# Patient Record
Sex: Female | Born: 1995 | Race: Black or African American | Hispanic: No | Marital: Single | Smoking: Former smoker
Health system: Southern US, Community
[De-identification: ages and names within clinical notes are randomized; demographics above are authoritative.]

## PROBLEM LIST (undated history)

## (undated) ENCOUNTER — Inpatient Hospital Stay (HOSPITAL_COMMUNITY): Payer: Self-pay

## (undated) DIAGNOSIS — J45909 Unspecified asthma, uncomplicated: Secondary | ICD-10-CM

## (undated) DIAGNOSIS — I10 Essential (primary) hypertension: Secondary | ICD-10-CM

## (undated) DIAGNOSIS — A749 Chlamydial infection, unspecified: Secondary | ICD-10-CM

## (undated) HISTORY — PX: NO PAST SURGERIES: SHX2092

---
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
## 2014-01-23 ENCOUNTER — Encounter (HOSPITAL_COMMUNITY): Payer: Self-pay | Admitting: Emergency Medicine
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
## 2016-02-12 ENCOUNTER — Encounter (HOSPITAL_COMMUNITY): Payer: Self-pay

## 2016-02-12 ENCOUNTER — Emergency Department (HOSPITAL_COMMUNITY)
Admission: EM | Admit: 2016-02-12 | Discharge: 2016-02-12 | Disposition: A | Discharge status: Home / Self Care | Payer: No Typology Code available for payment source | Attending: Emergency Medicine | Admitting: Emergency Medicine

## 2016-02-12 DIAGNOSIS — Y939 Activity, unspecified: Secondary | ICD-10-CM | POA: Diagnosis not present

## 2016-02-12 DIAGNOSIS — S3992XA Unspecified injury of lower back, initial encounter: Secondary | ICD-10-CM | POA: Diagnosis present

## 2016-02-12 DIAGNOSIS — Y999 Unspecified external cause status: Secondary | ICD-10-CM | POA: Insufficient documentation

## 2016-02-12 DIAGNOSIS — Z87891 Personal history of nicotine dependence: Secondary | ICD-10-CM | POA: Diagnosis not present

## 2016-02-12 DIAGNOSIS — I10 Essential (primary) hypertension: Secondary | ICD-10-CM | POA: Insufficient documentation

## 2016-02-12 DIAGNOSIS — Y92410 Unspecified street and highway as the place of occurrence of the external cause: Secondary | ICD-10-CM | POA: Insufficient documentation

## 2016-02-12 DIAGNOSIS — J45909 Unspecified asthma, uncomplicated: Secondary | ICD-10-CM | POA: Diagnosis not present

## 2016-02-12 DIAGNOSIS — S39012A Strain of muscle, fascia and tendon of lower back, initial encounter: Secondary | ICD-10-CM | POA: Diagnosis not present

## 2016-02-12 LAB — CBC
HCT: 38.3 % (ref 36.0–46.0)
HEMOGLOBIN: 12.4 g/dL (ref 12.0–15.0)
MCH: 28.8 pg (ref 26.0–34.0)
MCHC: 32.4 g/dL (ref 30.0–36.0)
MCV: 88.9 fL (ref 78.0–100.0)
PLATELETS: 319 10*3/uL (ref 150–400)
RBC: 4.31 MIL/uL (ref 3.87–5.11)
RDW: 13.5 % (ref 11.5–15.5)
WBC: 5.8 10*3/uL (ref 4.0–10.5)

## 2016-02-12 LAB — COMPREHENSIVE METABOLIC PANEL
ALK PHOS: 60 U/L (ref 38–126)
ALT: 21 U/L (ref 14–54)
ANION GAP: 5 (ref 5–15)
AST: 19 U/L (ref 15–41)
Albumin: 4.1 g/dL (ref 3.5–5.0)
BILIRUBIN TOTAL: 0.6 mg/dL (ref 0.3–1.2)
BUN: 14 mg/dL (ref 6–20)
CALCIUM: 8.9 mg/dL (ref 8.9–10.3)
CO2: 25 mmol/L (ref 22–32)
CREATININE: 0.61 mg/dL (ref 0.44–1.00)
Chloride: 108 mmol/L (ref 101–111)
Glucose, Bld: 85 mg/dL (ref 65–99)
Potassium: 3.9 mmol/L (ref 3.5–5.1)
Sodium: 138 mmol/L (ref 135–145)
TOTAL PROTEIN: 7.2 g/dL (ref 6.5–8.1)

## 2016-02-12 MED ORDER — IBUPROFEN 800 MG PO TABS: 800.0000 mg | ORAL_TABLET | Freq: Three times a day (TID) | ORAL | 0 refills | Status: AC

## 2016-02-12 MED ORDER — METHOCARBAMOL 500 MG PO TABS: 500.0000 mg | ORAL_TABLET | Freq: Two times a day (BID) | ORAL | 0 refills | Status: DC
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
## 2016-02-12 NOTE — ED Notes (Signed)
ED Provider at bedside. 
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
## 2017-04-28 ENCOUNTER — Encounter (HOSPITAL_BASED_OUTPATIENT_CLINIC_OR_DEPARTMENT_OTHER): Payer: Self-pay | Admitting: *Deleted

## 2017-04-28 ENCOUNTER — Other Ambulatory Visit: Payer: Self-pay

## 2017-04-28 ENCOUNTER — Emergency Department (HOSPITAL_BASED_OUTPATIENT_CLINIC_OR_DEPARTMENT_OTHER)
Admission: EM | Admit: 2017-04-28 | Discharge: 2017-04-28 | Disposition: A | Discharge status: Home / Self Care | Payer: Medicaid Other | Attending: Emergency Medicine | Admitting: Emergency Medicine

## 2017-04-28 DIAGNOSIS — J4541 Moderate persistent asthma with (acute) exacerbation: Secondary | ICD-10-CM | POA: Diagnosis not present

## 2017-04-28 DIAGNOSIS — J45909 Unspecified asthma, uncomplicated: Secondary | ICD-10-CM | POA: Diagnosis not present

## 2017-04-28 DIAGNOSIS — R0602 Shortness of breath: Secondary | ICD-10-CM | POA: Diagnosis present

## 2017-04-28 DIAGNOSIS — F1210 Cannabis abuse, uncomplicated: Secondary | ICD-10-CM | POA: Diagnosis not present

## 2017-04-28 DIAGNOSIS — I10 Essential (primary) hypertension: Secondary | ICD-10-CM | POA: Diagnosis not present

## 2017-04-28 DIAGNOSIS — Z87891 Personal history of nicotine dependence: Secondary | ICD-10-CM | POA: Diagnosis not present

## 2017-04-28 MED ORDER — IPRATROPIUM-ALBUTEROL 0.5-2.5 (3) MG/3ML IN SOLN
3.0000 mL | Freq: Once | RESPIRATORY_TRACT | Status: AC
  Administered 2017-04-28: 3 mL via RESPIRATORY_TRACT

## 2017-04-28 MED ORDER — PREDNISONE 20 MG PO TABS: 40.0000 mg | ORAL_TABLET | Freq: Every day | ORAL | 0 refills | Status: AC

## 2017-04-28 MED ORDER — ALBUTEROL SULFATE (2.5 MG/3ML) 0.083% IN NEBU
2.5000 mg | INHALATION_SOLUTION | Freq: Once | RESPIRATORY_TRACT | Status: AC
  Administered 2017-04-28: 2.5 mg via RESPIRATORY_TRACT

## 2017-04-28 MED ORDER — IPRATROPIUM-ALBUTEROL 0.5-2.5 (3) MG/3ML IN SOLN
RESPIRATORY_TRACT | Status: AC
  Administered 2017-04-28: 3 mL via RESPIRATORY_TRACT
  Filled 2017-04-28: qty 3

## 2017-04-28 MED ORDER — ALBUTEROL SULFATE (2.5 MG/3ML) 0.083% IN NEBU
INHALATION_SOLUTION | RESPIRATORY_TRACT | Status: AC
  Administered 2017-04-28: 2.5 mg via RESPIRATORY_TRACT
  Filled 2017-04-28: qty 3

## 2017-04-28 MED ORDER — ALBUTEROL SULFATE HFA 108 (90 BASE) MCG/ACT IN AERS
2.0000 | INHALATION_SPRAY | RESPIRATORY_TRACT | Status: DC
  Administered 2017-04-28: 2 via RESPIRATORY_TRACT
  Filled 2017-04-28: qty 6.7

## 2017-04-28 MED FILL — predniSONE 20 MG TABS: 20 | 5 days supply | Qty: 10 | Fill #0

## 2017-04-28 NOTE — ED Notes (Addendum)
Patient states she is feeling more SOB O2 is 100% HR 102 RT called for assessment. Respirations is 28

## 2017-04-28 NOTE — ED Triage Notes (Signed)
Pt reports SOB x 30 mins pta. Already assess by RT and received breathing Tx

## 2017-04-28 NOTE — ED Notes (Signed)
Hx Asthma, HFA taken at work ( not hers). No current Rx for Asthma

## 2017-04-28 NOTE — ED Provider Notes (Signed)
[facility] EMERGENCY DEPARTMENT Provider Note   CSN: [id number] Arrival date & time: 04/28/17  1019     History   Chief Complaint Chief Complaint  Patient presents with  . Asthma    HPI Tammy Gonzales is a 22 y.o. female.  HPI 22 year old female with a history of asthma presents with acute shortness of breath which began today.  It started off mild and she felt like she was wheezing.  She then became worse.  Ultimately she used a friend's albuterol inhaler and was told to come to the emergency department for further evaluation.  She states she feels much better at this time.  She was wheezing on arrival and received 2 breathing treatments prior to my evaluation.  Denies fevers or chills.  No exertional shortness of breath.  Ambulates in the hall without issues at this time.  Feels much better would like to go home.   Past Medical History:  Diagnosis Date  . Asthma   . Chlamydia 2014   s/p treatment, ? TOC  . Hypertension    treated in 8th grade, unclear etiology, no recurrence since that time    Patient Active Problem List   Diagnosis Date Noted  . Leg swelling 02/19/2013  . Supervision of low-risk first pregnancy 02/07/2013  . Chlamydia infection complicating pregnancy in third trimester 02/07/2013  . Insufficient prenatal care in third trimester 02/07/2013    Past Surgical History:  Procedure Laterality Date  . NO PAST SURGERIES      OB History    Gravida Para Term Preterm AB Living   2 1 1  0 1 1   SAB TAB Ectopic Multiple Live Births   1 0 0 0 1       Home Medications    Prior to Admission medications   Medication Sig Start Date End Date Taking? Authorizing Provider  ibuprofen (ADVIL,MOTRIN) 800 MG tablet Take 1 tablet (800 mg total) by mouth 3 (three) times daily. 02/12/16   Arby Barrette, MD  methocarbamol (ROBAXIN) 500 MG tablet Take 1 tablet (500 mg total) by mouth 2 (two) times daily. Patient not taking: Reported on 02/12/2016 12/04/13    Piepenbrink, Victorino Dike, PA-C  methocarbamol (ROBAXIN) 500 MG tablet Take 1 tablet (500 mg total) by mouth 2 (two) times daily. 02/12/16   Arby Barrette, MD  predniSONE (DELTASONE) 20 MG tablet Take 2 tablets (40 mg total) by mouth daily for 5 days. 04/28/17 05/03/17  Azalia Bilis, MD  traMADol-acetaminophen (ULTRACET) 37.5-325 MG per tablet Take 1 tablet by mouth every 6 (six) hours as needed. Patient not taking: Reported on 02/12/2016 12/02/13   Garlon Hatchet, PA-C    Family History Family History  Problem Relation Age of Onset  . Diabetes Mother   . Hypertension Mother   . Hypertension Paternal Grandmother   . Sickle cell anemia Neg Hx   . Bleeding Disorder Neg Hx     Social History Social History   Tobacco Use  . Smoking status: Former Smoker    Types: Cigarettes  . Smokeless tobacco: Never Used  Substance Use Topics  . Alcohol use: Yes    Comment: occasional  . Drug use: Yes    Types: Marijuana    Comment: stopped in 05/2012     Allergies   Patient has no known allergies.   Review of Systems Review of Systems  All other systems reviewed and are negative.    Physical Exam Updated Vital Signs BP 137/66 (BP Location: Right Arm)  Pulse (!) 120 Comment: after HHN  Temp 98.4 F (36.9 C) (Oral)   Resp 20   Ht 5\' 1"  (1.549 m)   Wt 74.8 kg (165 lb)   LMP 03/26/2017   SpO2 99%   BMI 31.18 kg/m   Physical Exam  Constitutional: She is oriented to person, place, and time. She appears well-developed and well-nourished. No distress.  HENT:  Head: Normocephalic and atraumatic.  Eyes: EOM are normal.  Neck: Normal range of motion.  Cardiovascular: Normal rate and regular rhythm.  Pulmonary/Chest: Effort normal and breath sounds normal.  Abdominal: Soft. She exhibits no distension. There is no tenderness.  Musculoskeletal: Normal range of motion.  Neurological: She is alert and oriented to person, place, and time.  Skin: Skin is warm and dry.  Psychiatric: She  has a normal mood and affect. Judgment normal.  Nursing note and vitals reviewed.    ED Treatments / Results  Labs (all labs ordered are listed, but only abnormal results are displayed) Labs Reviewed - No data to display  EKG  EKG Interpretation None       Radiology No results found.  Procedures Procedures (including critical care time)  Medications Ordered in ED Medications  albuterol (PROVENTIL HFA;VENTOLIN HFA) 108 (90 Base) MCG/ACT inhaler 2 puff (not administered)  ipratropium-albuterol (DUONEB) 0.5-2.5 (3) MG/3ML nebulizer solution 3 mL (3 mLs Nebulization Given 04/28/17 1031)  albuterol (PROVENTIL) (2.5 MG/3ML) 0.083% nebulizer solution 2.5 mg (2.5 mg Nebulization Given 04/28/17 1031)  albuterol (PROVENTIL) (2.5 MG/3ML) 0.083% nebulizer solution 2.5 mg (2.5 mg Nebulization Given 04/28/17 1155)  ipratropium-albuterol (DUONEB) 0.5-2.5 (3) MG/3ML nebulizer solution 3 mL (3 mLs Nebulization Given 04/28/17 1155)     Initial Impression / Assessment and Plan / ED Course  I have reviewed the triage vital signs and the nursing notes.  Pertinent labs & imaging results that were available during my care of the patient were reviewed by me and considered in my medical decision making (see chart for details).     No longer wheezing.  Feels much better.  Suspect asthma exacerbation.  Lungs are clear now.  Doubt pneumonia.  Home with steroids and bronchodilators.  Patient understands return to the ER for new or worsening symptoms.  Final Clinical Impressions(s) / ED Diagnoses   Final diagnoses:  Moderate persistent asthma with exacerbation    ED Discharge Orders        Ordered    predniSONE (DELTASONE) 20 MG tablet  Daily     04/28/17 1329       Azalia Bilisampos, Datra Clary, MD 04/28/17 1337

## 2018-05-14 ENCOUNTER — Emergency Department (HOSPITAL_COMMUNITY)
Admission: EM | Admit: 2018-05-14 | Discharge: 2018-05-15 | Disposition: A | Discharge status: Home / Self Care | Payer: Medicaid Other | Attending: Emergency Medicine | Admitting: Emergency Medicine

## 2018-05-14 DIAGNOSIS — Z87891 Personal history of nicotine dependence: Secondary | ICD-10-CM | POA: Insufficient documentation

## 2018-05-14 DIAGNOSIS — Z79899 Other long term (current) drug therapy: Secondary | ICD-10-CM | POA: Insufficient documentation

## 2018-05-14 DIAGNOSIS — Z5320 Procedure and treatment not carried out because of patient's decision for unspecified reasons: Secondary | ICD-10-CM | POA: Insufficient documentation

## 2018-05-14 DIAGNOSIS — J45909 Unspecified asthma, uncomplicated: Secondary | ICD-10-CM | POA: Insufficient documentation

## 2018-05-14 DIAGNOSIS — R55 Syncope and collapse: Secondary | ICD-10-CM

## 2018-05-14 LAB — BASIC METABOLIC PANEL
ANION GAP: 10 (ref 5–15)
BUN: 9 mg/dL (ref 6–20)
CALCIUM: 8.8 mg/dL — AB (ref 8.9–10.3)
CHLORIDE: 105 mmol/L (ref 98–111)
CO2: 21 mmol/L — AB (ref 22–32)
CREATININE: 0.54 mg/dL (ref 0.44–1.00)
GFR calc Af Amer: >60 mL/min (ref >60)
GFR calc non Af Amer: >60 mL/min (ref >60)
GLUCOSE: 97 mg/dL (ref 70–99)
Potassium: 4.1 mmol/L (ref 3.5–5.1)
Sodium: 136 mmol/L (ref 135–145)

## 2018-05-14 LAB — CBC
HEMATOCRIT: 31.8 % — AB (ref 36.0–46.0)
Hemoglobin: 9.9 g/dL — ABNORMAL LOW (ref 12.0–15.0)
MCH: 25.4 pg — ABNORMAL LOW (ref 26.0–34.0)
MCHC: 31.1 g/dL (ref 30.0–36.0)
MCV: 81.5 fL (ref 80.0–100.0)
NRBC: 0 % (ref 0.0–0.2)
PLATELETS: 489 10*3/uL — AB (ref 150–400)
RBC: 3.9 MIL/uL (ref 3.87–5.11)
RDW: 16.3 % — ABNORMAL HIGH (ref 11.5–15.5)
WBC: 12.7 10*3/uL — ABNORMAL HIGH (ref 4.0–10.5)

## 2018-05-14 LAB — I-STAT BETA HCG BLOOD, ED (MC, WL, AP ONLY): I-stat hCG, quantitative: <5 m[IU]/mL (ref <5)

## 2018-05-14 MED ORDER — SODIUM CHLORIDE 0.9% FLUSH: 3.0000 mL | Freq: Once | INTRAVENOUS | Status: DC

## 2018-05-14 MED ORDER — KETOROLAC TROMETHAMINE 30 MG/ML IJ SOLN: 30.0000 mg | Freq: Once | INTRAMUSCULAR | Status: DC

## 2018-05-14 MED ORDER — SODIUM CHLORIDE 0.9 % IV BOLUS: 1000.0000 mL | Freq: Once | INTRAVENOUS | Status: DC

## 2018-05-14 NOTE — ED Notes (Signed)
Pt remains in waiting room. Updated on wait for treatment room. 

## 2018-05-14 NOTE — ED Triage Notes (Signed)
Pt was at work when she felt like she was getting overheated and dizzy, pt then passed out and was caught by a Radio broadcast assistant. Current complaint is a gen. H/a, has not been taking prescribed iron pills x 3 months. A&O x4.

## 2018-05-14 NOTE — ED Provider Notes (Signed)
[facility] EMERGENCY DEPARTMENT Provider Note   CSN: [id number] Arrival date & time: 05/14/18  1833    History   Chief Complaint Chief Complaint  Patient presents with  . Loss of Consciousness    HPI Tammy Gonzales is a 23 y.o. female.     The history is provided by the patient and medical records.  Loss of Consciousness     23 year old female with history of asthma, iron deficiency anemia, presenting to the ED after a syncopal event.  Patient reports she was recently sick with influenza and when she arrived at work today she noticed that she felt somewhat overheated and lightheaded.  She told her boss this and asked if she could eat something quickly, however but was told to immediately start work.  States symptoms worsened and she got increasingly lightheaded, apparently had a syncopal episode but woke up and coworkers arms on the floor.  Coworker reported to her that she caught her and was able to lower her to the ground.  There is no reported head injury or trauma.  No seizure activity.  Patient is unsure exactly how long she was unconscious.  States currently she just has a headache.  She denies any chest pain or shortness of breath.  She does have history of anemia, has not been taking her iron pills for 3 months.  When asked why she stated she was not sure, mostly she just did not think they were that important.  She has been trying to eat and drink, slightly less intake than normal given her recent illness.  She has not had any vomiting or diarrhea.  She does report some random, intermittent lower abdominal pains.  States it feels like a "menstrual cramp".  Her cycles have been regular.  She denies any urinary symptoms or pelvic pain.  Past Medical History:  Diagnosis Date  . Asthma   . Chlamydia 2014   s/p treatment, ? TOC  . Hypertension    treated in 8th grade, unclear etiology, no recurrence since that time    Patient Active Problem List   Diagnosis Date  Noted  . Leg swelling 02/19/2013  . Supervision of low-risk first pregnancy 02/07/2013  . Chlamydia infection complicating pregnancy in third trimester 02/07/2013  . Insufficient prenatal care in third trimester 02/07/2013    Past Surgical History:  Procedure Laterality Date  . NO PAST SURGERIES       OB History    Gravida  2   Para  1   Term  1   Preterm  0   AB  1   Living  1     SAB  1   TAB  0   Ectopic  0   Multiple  0   Live Births  1            Home Medications    Prior to Admission medications   Medication Sig Start Date End Date Taking? Authorizing Provider  ibuprofen (ADVIL,MOTRIN) 800 MG tablet Take 1 tablet (800 mg total) by mouth 3 (three) times daily. 02/12/16   Arby Barrette, MD  methocarbamol (ROBAXIN) 500 MG tablet Take 1 tablet (500 mg total) by mouth 2 (two) times daily. Patient not taking: Reported on 02/12/2016 12/04/13   Piepenbrink, Victorino Dike, PA-C  methocarbamol (ROBAXIN) 500 MG tablet Take 1 tablet (500 mg total) by mouth 2 (two) times daily. 02/12/16   Arby Barrette, MD  traMADol-acetaminophen (ULTRACET) 37.5-325 MG per tablet Take 1 tablet by mouth  every 6 (six) hours as needed. Patient not taking: Reported on 02/12/2016 12/02/13   Garlon Hatchet, PA-C    Family History Family History  Problem Relation Age of Onset  . Diabetes Mother   . Hypertension Mother   . Hypertension Paternal Grandmother   . Sickle cell anemia Neg Hx   . Bleeding Disorder Neg Hx     Social History Social History   Tobacco Use  . Smoking status: Former Smoker    Types: Cigarettes  . Smokeless tobacco: Never Used  Substance Use Topics  . Alcohol use: Yes    Comment: occasional  . Drug use: Yes    Types: Marijuana    Comment: stopped in 05/2012     Allergies   Patient has no known allergies.   Review of Systems Review of Systems  Cardiovascular: Positive for syncope.  Neurological: Positive for syncope.  All other systems reviewed  and are negative.    Physical Exam Updated Vital Signs BP 116/86   Pulse 100   Resp 16   LMP 04/15/2018   SpO2 100%   Physical Exam Vitals signs and nursing note reviewed.  Constitutional:      General: She is not in acute distress.    Appearance: She is well-developed. She is not diaphoretic.     Comments: texting on cell phone, upright in bed, NAD  HENT:     Head: Normocephalic and atraumatic.     Right Ear: Tympanic membrane, ear canal and external ear normal.     Left Ear: Tympanic membrane, ear canal and external ear normal.     Nose: Nose normal.     Mouth/Throat:     Lips: Pink.     Mouth: Mucous membranes are moist.     Pharynx: Oropharynx is clear.  Eyes:     Conjunctiva/sclera: Conjunctivae normal.     Pupils: Pupils are equal, round, and reactive to light.  Neck:     Musculoskeletal: Full passive range of motion without pain, normal range of motion and neck supple. No neck rigidity.     Comments: No rigidity, no meningismus Cardiovascular:     Rate and Rhythm: Normal rate and regular rhythm.     Heart sounds: Normal heart sounds. No murmur.  Pulmonary:     Effort: Pulmonary effort is normal. No respiratory distress.     Breath sounds: Normal breath sounds. No decreased breath sounds, wheezing or rhonchi.  Abdominal:     General: Bowel sounds are normal.     Palpations: Abdomen is soft.     Tenderness: There is no abdominal tenderness. There is no guarding.  Musculoskeletal: Normal range of motion.  Skin:    General: Skin is warm and dry.     Findings: No rash.  Neurological:     Mental Status: She is alert and oriented to person, place, and time.     Cranial Nerves: No cranial nerve deficit.     Sensory: No sensory deficit.     Motor: No tremor or seizure activity.     Comments: AAOx3, answering questions and following commands appropriately; equal strength UE and LE bilaterally; CN grossly intact; moves all extremities appropriately without ataxia; no  focal neuro deficits or facial asymmetry appreciated  Psychiatric:        Behavior: Behavior normal.        Thought Content: Thought content normal.      ED Treatments / Results  Labs (all labs ordered are listed, but only abnormal results  are displayed) Labs Reviewed  BASIC METABOLIC PANEL - Abnormal; Notable for the following components:      Result Value   CO2 21 (*)    Calcium 8.8 (*)    All other components within normal limits  CBC - Abnormal; Notable for the following components:   WBC 12.7 (*)    Hemoglobin 9.9 (*)    HCT 31.8 (*)    MCH 25.4 (*)    RDW 16.3 (*)    Platelets 489 (*)    All other components within normal limits  URINALYSIS, ROUTINE W REFLEX MICROSCOPIC  I-STAT BETA HCG BLOOD, ED ([facility], [facility], [facility] ONLY)  CBG MONITORING, ED    EKG None  Radiology No results found.  Procedures Procedures (including critical care time)  Medications Ordered in ED Medications  sodium chloride flush (NS) 0.9 % injection 3 mL (has no administration in time range)  sodium chloride 0.9 % bolus 1,000 mL (has no administration in time range)  ketorolac (TORADOL) 30 MG/ML injection 30 mg (has no administration in time range)     Initial Impression / Assessment and Plan / ED Course  I have reviewed the triage vital signs and the nursing notes.  Pertinent labs & imaging results that were available during my care of the patient were reviewed by me and considered in my medical decision making (see chart for details).  23 year old female here after syncopal episode at work.  Had been feeling lightheaded and somewhat overheated.  Recent influenza.  Has also not been taking her iron pills for about 3 months.  She is awake, alert, oriented to baseline.  She does report generalized headache but is neurologically intact.  No meningeal signs.  Labs are overall reassuring.  Hemoglobin is lower than prior, suspect this likely reflects her chronic iron deficiency anemia.  With recent flu  has not been eating and drinking as much as normal which may have contributed.  Has also had some intermittent lower abdominal pain, none at present.  Denies urinary symptoms or pelvic pain.  Plan for urinalysis.  Plan for IV fluids, Toradol for headache.  Will reassess.  11:49 PM Notified that patient has left.  I went to her room to try and talk to her but she was already gone, room had been cleaned.  She did not receive any of the fluids or medications I ordered for her at 10:15 PM.  She also did not provide urine sample.  I did not get a reassess, or talk with patient about final disposition, discharge instructions, or follow-up prior to her leaving the department.  Final Clinical Impressions(s) / ED Diagnoses   Final diagnoses:  Syncope, unspecified syncope type    ED Discharge Orders    None       Garlon HatchetSanders, Cloma Rahrig M, PA-C 05/15/18 0019    Pricilla LovelessGoldston, Scott, MD 05/15/18 2330

## 2018-05-15 NOTE — ED Notes (Signed)
Pt walked out before iv and ,eds given

## 2018-05-17 ENCOUNTER — Ambulatory Visit
Admission: EM | Admit: 2018-05-17 | Discharge: 2018-05-17 | Disposition: A | Discharge status: Home / Self Care | Payer: Self-pay

## 2018-05-17 ENCOUNTER — Encounter: Payer: Self-pay | Admitting: Emergency Medicine

## 2018-05-17 DIAGNOSIS — N3001 Acute cystitis with hematuria: Secondary | ICD-10-CM | POA: Insufficient documentation

## 2018-05-17 DIAGNOSIS — N760 Acute vaginitis: Secondary | ICD-10-CM | POA: Insufficient documentation

## 2018-05-17 LAB — POCT URINALYSIS DIP (MANUAL ENTRY)
Glucose, UA: 100 mg/dL — AB
NITRITE UA: POSITIVE — AB
PH UA: 6.5 (ref 5.0–8.0)
Spec Grav, UA: 1.025 (ref 1.010–1.025)
UROBILINOGEN UA: 2 U/dL — AB

## 2018-05-17 MED ORDER — METRONIDAZOLE 500 MG PO TABS: 500.0000 mg | ORAL_TABLET | Freq: Two times a day (BID) | ORAL | 0 refills | Status: DC

## 2018-05-17 MED ORDER — NITROFURANTOIN MONOHYD MACRO 100 MG PO CAPS: 100.0000 mg | ORAL_CAPSULE | Freq: Two times a day (BID) | ORAL | 0 refills | Status: DC

## 2018-05-17 MED ORDER — PHENAZOPYRIDINE HCL 200 MG PO TABS: 200.0000 mg | ORAL_TABLET | Freq: Three times a day (TID) | ORAL | 0 refills | Status: AC

## 2018-05-17 NOTE — ED Notes (Signed)
Patient able to ambulate independently  

## 2018-05-17 NOTE — ED Triage Notes (Signed)
Pt presents to [facility] for assessment of lower abdominal pain/pelvic pain x 2 weeks.  Denies discharge.  Pt c/o pressure feeling, pain with sexual intercourse.  LMP 05/17/18.

## 2018-05-17 NOTE — Discharge Instructions (Signed)
Take medications as prescribed.  Drink plenty of fluids.  We will only call you with abnormal results.  You can go onto MyChart in the next couple of days and look up the rest of your lab results.

## 2018-05-17 NOTE — ED Provider Notes (Signed)
[facility]    CSN: [id number] Arrival date & time: 05/17/18  1257     History   Chief Complaint Chief Complaint  Patient presents with  . Abdominal Pain    HPI Tammy Gonzales is a 23 y.o. female.   Subjective:   Tammy Gonzales is a 23 y.o. female who presents for evaluation of vaginal symptoms of perineal odor, vaginal discharge, dysuria, pain with intercourse and abdominal pain located in the suprapubic described as pressure-like without radiation. Symptoms have been present for 2 weeks.  Menstrual pattern: bleeding regularly and currently on her menses.   Contraception: Sexually active with one female partner; uses condoms 100% of his time with this partner but not always in the past.  STI Risk: Possible STD exposure   The following portions of the patient's history were reviewed and updated as appropriate: allergies, current medications, past family history, past medical history, past social history, past surgical history and problem list.        Past Medical History:  Diagnosis Date  . Asthma   . Chlamydia 2014   s/p treatment, ? TOC  . Hypertension    treated in 8th grade, unclear etiology, no recurrence since that time    Patient Active Problem List   Diagnosis Date Noted  . Leg swelling 02/19/2013  . Supervision of low-risk first pregnancy 02/07/2013  . Chlamydia infection complicating pregnancy in third trimester 02/07/2013  . Insufficient prenatal care in third trimester 02/07/2013    Past Surgical History:  Procedure Laterality Date  . NO PAST SURGERIES      OB History    Gravida  2   Para  1   Term  1   Preterm  0   AB  1   Living  1     SAB  1   TAB  0   Ectopic  0   Multiple  0   Live Births  1            Home Medications    Prior to Admission medications   Medication Sig Start Date End Date Taking? Authorizing Provider  ferrous sulfate 325 (65 FE) MG tablet Take 325 mg by mouth daily with breakfast.   Yes  [provider]  ibuprofen (ADVIL,MOTRIN) 800 MG tablet Take 1 tablet (800 mg total) by mouth 3 (three) times daily. 02/12/16  Yes Arby Barrette, MD  metroNIDAZOLE (FLAGYL) 500 MG tablet Take 1 tablet (500 mg total) by mouth 2 (two) times daily. 05/17/18   Lurline Idol, FNP  nitrofurantoin, macrocrystal-monohydrate, (MACROBID) 100 MG capsule Take 1 capsule (100 mg total) by mouth 2 (two) times daily. 05/17/18   Lurline Idol, FNP  phenazopyridine (PYRIDIUM) 200 MG tablet Take 1 tablet (200 mg total) by mouth 3 (three) times daily. 05/17/18   Lurline Idol, FNP    Family History Family History  Problem Relation Age of Onset  . Diabetes Mother   . Hypertension Mother   . Hypertension Paternal Grandmother   . Sickle cell anemia Neg Hx   . Bleeding Disorder Neg Hx     Social History Social History   Tobacco Use  . Smoking status: Former Smoker    Types: Cigarettes  . Smokeless tobacco: Never Used  Substance Use Topics  . Alcohol use: Yes    Comment: occasional  . Drug use: Yes    Types: Marijuana    Comment: stopped in 05/2012     Allergies   Patient has no known allergies.  Review of Systems Review of Systems  Gastrointestinal: Positive for abdominal pain. Negative for nausea and vomiting.  Genitourinary: Positive for dyspareunia, dysuria, pelvic pain and vaginal discharge. Negative for flank pain and frequency.  Musculoskeletal: Negative for back pain.  All other systems reviewed and are negative.    Physical Exam Triage Vital Signs ED Triage Vitals  Enc Vitals Group     BP 05/17/18 1337 116/81     Pulse Rate 05/17/18 1337 (!) 109     Resp 05/17/18 1337 16     Temp 05/17/18 1337 98.3 F (36.8 C)     Temp Source 05/17/18 1337 Oral     SpO2 05/17/18 1337 99 %     Weight --      Height --      Head Circumference --      Peak Flow --      Pain Score 05/17/18 1338 0     Pain Loc --      Pain Edu? --      Excl. in GC? --    No data  found.  Updated Vital Signs BP 116/81 (BP Location: Left Arm)   Pulse (!) 109   Temp 98.3 F (36.8 C) (Oral)   Resp 16   LMP 05/17/2018   SpO2 99%   Visual Acuity Right Eye Distance:   Left Eye Distance:   Bilateral Distance:    Right Eye Near:   Left Eye Near:    Bilateral Near:     Physical Exam Vitals signs reviewed.  Constitutional:      General: She is not in acute distress.    Appearance: She is well-developed. She is not ill-appearing, toxic-appearing or diaphoretic.  HENT:     Head: Normocephalic.  Cardiovascular:     Rate and Rhythm: Normal rate and regular rhythm.  Pulmonary:     Effort: Pulmonary effort is normal.     Breath sounds: Normal breath sounds.  Abdominal:     General: Bowel sounds are normal.     Palpations: Abdomen is soft.     Tenderness: There is no abdominal tenderness. There is no right CVA tenderness or left CVA tenderness.  Skin:    General: Skin is warm.  Neurological:     General: No focal deficit present.     Mental Status: She is alert and oriented to person, place, and time.  Psychiatric:        Mood and Affect: Mood normal.        Behavior: Behavior normal.      UC Treatments / Results  Labs (all labs ordered are listed, but only abnormal results are displayed) Labs Reviewed  POCT URINALYSIS DIP (MANUAL ENTRY) - Abnormal; Notable for the following components:      Result Value   Color, UA red (*)    Clarity, UA cloudy (*)    Glucose, UA =100 (*)    Bilirubin, UA moderate (*)    Ketones, POC UA trace (5) (*)    Blood, UA large (*)    Protein Ur, POC >=300 (*)    Urobilinogen, UA 2.0 (*)    Nitrite, UA Positive (*)    Leukocytes, UA Large (3+) (*)    All other components within normal limits  CERVICOVAGINAL ANCILLARY ONLY    EKG None  Radiology No results found.  Procedures Procedures (including critical care time)  Medications Ordered in UC Medications - No data to display  Initial Impression /  Assessment and Plan /  UC Course  I have reviewed the triage vital signs and the nursing notes.  Pertinent labs & imaging results that were available during my care of the patient were reviewed by me and considered in my medical decision making (see chart for details).     23 year old female presenting with vaginal discharge, dysuria, pain with intercourse and abdominal pressure for 2 weeks.  She is sexually active.  She currently uses condoms but has not in the past.  She is unsure of possible STD exposure. Urine dipstick shows 3+ for leukocyte esterase and positive for nitrites.  Will treat for UTI.  Nu swab for GC, chlamydia, BV and yeast pending.    Plan: 1. Medications: nitrofurantoin and flagyl  2. Maintain adequate hydration 3. Abstinence from intercourse discussed. 4. Discussed safe sex. 5. Follow up if symptoms not improving, and prn.  Today's evaluation has revealed no signs of a dangerous process. Discussed diagnosis with patient. Patient aware of their diagnosis, possible red flag symptoms to watch out for and need for close follow up. Patient understands verbal and written discharge instructions. Patient comfortable with plan and disposition.  Patient has a clear mental status at this time, good insight into illness (after discussion and teaching) and has clear judgment to make decisions regarding their care.  Documentation was completed with the aid of voice recognition software. Transcription may contain typographical errors. Final Clinical Impressions(s) / UC Diagnoses   Final diagnoses:  Acute cystitis with hematuria  Acute vaginitis     Discharge Instructions     Take medications as prescribed.  Drink plenty of fluids.  We will only call you with abnormal results.  You can go onto MyChart in the next couple of days and look up the rest of your lab results.    ED Prescriptions    Medication Sig Dispense Auth. Provider   nitrofurantoin, macrocrystal-monohydrate,  (MACROBID) 100 MG capsule Take 1 capsule (100 mg total) by mouth 2 (two) times daily. 10 capsule Lurline Idol, FNP   metroNIDAZOLE (FLAGYL) 500 MG tablet Take 1 tablet (500 mg total) by mouth 2 (two) times daily. 14 tablet Lurline Idol, FNP   phenazopyridine (PYRIDIUM) 200 MG tablet Take 1 tablet (200 mg total) by mouth 3 (three) times daily. 6 tablet Lurline Idol, FNP     Controlled Substance Prescriptions [place] Controlled Substance Registry consulted? Not Applicable   Lurline Idol, [place] 05/17/18 [phone]

## 2018-05-19 LAB — URINE CULTURE: Special Requests: NORMAL

## 2018-05-19 LAB — CERVICOVAGINAL ANCILLARY ONLY
BACTERIAL VAGINITIS: POSITIVE — AB
CHLAMYDIA, DNA PROBE: POSITIVE — AB
Candida vaginitis: NEGATIVE
NEISSERIA GONORRHEA: POSITIVE — AB
TRICH (WINDOWPATH): NEGATIVE

## 2018-05-20 ENCOUNTER — Encounter (HOSPITAL_COMMUNITY): Payer: Self-pay

## 2018-05-20 ENCOUNTER — Telehealth (HOSPITAL_COMMUNITY): Payer: Self-pay | Admitting: Emergency Medicine

## 2018-05-20 NOTE — Telephone Encounter (Signed)
Bacterial Vaginosis test is positive.  Prescription for metronidazole was given at the urgent care visit.   Chlamydia is positive. This was not treated  Pt needs education to please refrain from sexual intercourse for 7 days to give the medicine time to work, sexual partners need to be notified and tested/treated.  Condoms may reduce risk of reinfection.  Recheck or followup with PCP for further evaluation if symptoms are not improving.   [facility] notified.  Gonorrhea is positive.  Patient should return as soon as possible to the urgent care for treatment with IM rocephin 250mg  and po zithromax 1g. Patient will not need to see a provider unless there are new symptoms she would like evaluated. Pt needs education to refrain from sexual intercourse for now and for 7 days after treatment to give the medicine time to work. Sexual partners need to be notified and tested/treated. Condoms may reduce risk of reinfection. [facility] notified.   Attempted to reach patient. No answer at this time. Voicemail left.

## 2018-05-22 ENCOUNTER — Telehealth (HOSPITAL_COMMUNITY): Payer: Self-pay | Admitting: Emergency Medicine

## 2018-05-22 NOTE — Telephone Encounter (Signed)
Patient contacted and made aware of all results, all questions answered. 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

## 2018-07-22 ENCOUNTER — Other Ambulatory Visit: Payer: Self-pay

## 2018-07-22 ENCOUNTER — Emergency Department (HOSPITAL_COMMUNITY)
Admission: EM | Admit: 2018-07-22 | Discharge: 2018-07-22 | Discharge status: Against Medical Advice | Payer: Self-pay | Attending: Emergency Medicine | Admitting: Emergency Medicine

## 2018-07-22 DIAGNOSIS — R1031 Right lower quadrant pain: Secondary | ICD-10-CM | POA: Insufficient documentation

## 2018-07-22 DIAGNOSIS — I10 Essential (primary) hypertension: Secondary | ICD-10-CM | POA: Insufficient documentation

## 2018-07-22 DIAGNOSIS — Z87891 Personal history of nicotine dependence: Secondary | ICD-10-CM | POA: Insufficient documentation

## 2018-07-22 DIAGNOSIS — Z79899 Other long term (current) drug therapy: Secondary | ICD-10-CM | POA: Insufficient documentation

## 2018-07-22 DIAGNOSIS — J45909 Unspecified asthma, uncomplicated: Secondary | ICD-10-CM | POA: Insufficient documentation

## 2018-07-22 LAB — WET PREP, GENITAL
Clue Cells Wet Prep HPF POC: NONE SEEN
Sperm: NONE SEEN
Trich, Wet Prep: NONE SEEN
Yeast Wet Prep HPF POC: NONE SEEN

## 2018-07-22 LAB — CBC WITH DIFFERENTIAL/PLATELET
Abs Immature Granulocytes: 0.04 10*3/uL (ref 0.00–0.07)
Basophils Absolute: 0 10*3/uL (ref 0.0–0.1)
Basophils Relative: 0 %
Eosinophils Absolute: 0.1 10*3/uL (ref 0.0–0.5)
Eosinophils Relative: 1 %
HCT: 27 % — ABNORMAL LOW (ref 36.0–46.0)
Hemoglobin: 8.5 g/dL — ABNORMAL LOW (ref 12.0–15.0)
Immature Granulocytes: 0 %
Lymphocytes Relative: 12 %
Lymphs Abs: 1.1 10*3/uL (ref 0.7–4.0)
MCH: 24 pg — ABNORMAL LOW (ref 26.0–34.0)
MCHC: 31.5 g/dL (ref 30.0–36.0)
MCV: 76.3 fL — ABNORMAL LOW (ref 80.0–100.0)
Monocytes Absolute: 0.8 10*3/uL (ref 0.1–1.0)
Monocytes Relative: 9 %
Neutro Abs: 7.6 10*3/uL (ref 1.7–7.7)
Neutrophils Relative %: 78 %
Platelets: 463 10*3/uL — ABNORMAL HIGH (ref 150–400)
RBC: 3.54 MIL/uL — ABNORMAL LOW (ref 3.87–5.11)
RDW: 19.7 % — ABNORMAL HIGH (ref 11.5–15.5)
WBC: 9.7 10*3/uL (ref 4.0–10.5)
nRBC: 0 % (ref 0.0–0.2)

## 2018-07-22 LAB — COMPREHENSIVE METABOLIC PANEL
ALT: 17 U/L (ref 0–44)
AST: 13 U/L — ABNORMAL LOW (ref 15–41)
Albumin: 3 g/dL — ABNORMAL LOW (ref 3.5–5.0)
Alkaline Phosphatase: 62 U/L (ref 38–126)
Anion gap: 11 (ref 5–15)
BUN: 9 mg/dL (ref 6–20)
CO2: 21 mmol/L — ABNORMAL LOW (ref 22–32)
Calcium: 8.6 mg/dL — ABNORMAL LOW (ref 8.9–10.3)
Chloride: 107 mmol/L (ref 98–111)
Creatinine, Ser: 0.62 mg/dL (ref 0.44–1.00)
GFR calc Af Amer: >60 mL/min (ref >60)
GFR calc non Af Amer: >60 mL/min (ref >60)
Glucose, Bld: 87 mg/dL (ref 70–99)
Potassium: 3 mmol/L — ABNORMAL LOW (ref 3.5–5.1)
Sodium: 139 mmol/L (ref 135–145)
Total Bilirubin: 0.3 mg/dL (ref 0.3–1.2)
Total Protein: 7.6 g/dL (ref 6.5–8.1)

## 2018-07-22 LAB — URINALYSIS, ROUTINE W REFLEX MICROSCOPIC
Bilirubin Urine: NEGATIVE
Glucose, UA: NEGATIVE mg/dL
Ketones, ur: 20 mg/dL — AB
Leukocytes,Ua: NEGATIVE
Nitrite: NEGATIVE
Protein, ur: 30 mg/dL — AB
Specific Gravity, Urine: 1.027 (ref 1.005–1.030)
pH: 5 (ref 5.0–8.0)

## 2018-07-22 LAB — POC URINE PREG, ED: Preg Test, Ur: NEGATIVE

## 2018-07-22 MED ORDER — ONDANSETRON HCL 4 MG/2ML IJ SOLN
4.0000 mg | Freq: Once | INTRAMUSCULAR | Status: AC
  Administered 2018-07-22: 4 mg via INTRAVENOUS
  Filled 2018-07-22: qty 2

## 2018-07-22 MED ORDER — FENTANYL CITRATE (PF) 100 MCG/2ML IJ SOLN
50.0000 ug | Freq: Once | INTRAMUSCULAR | Status: AC
  Administered 2018-07-22: 15:00:00 50 ug via INTRAVENOUS
  Filled 2018-07-22: qty 2

## 2018-07-22 NOTE — ED Triage Notes (Signed)
Pt arrives pov with c/o of lower abd pain.

## 2018-07-22 NOTE — ED Notes (Signed)
Went to check on pt and found that she was not in the room. Her gown was left on the bed and her belongings were gone. Checked nearest bathroom for pt and she was not found.

## 2018-07-22 NOTE — ED Provider Notes (Signed)
[facility] EMERGENCY DEPARTMENT Provider Note   CSN: [id number] Arrival date & time: 07/22/18  1249    History   Chief Complaint Chief Complaint  Patient presents with  . Abdominal Pain    HPI Tammy Gonzales is a 23 y.o. female.     The history is provided by the patient and medical records. No language interpreter was used.  Abdominal Pain   Tammy Gonzales is a 23 y.o. female who presents to the Emergency Department complaining of abdominal pain.  She presents to the ED complaining of one week of lower abdominal pain located in the suprapubic and RLQ area.  Pain began abruptly and was severe in onset.  It has been waxing and waning over the last week but gradually worsnening.  She has associated nausea and three days of dysuria.  She has vaginal discharge that she describes as chronic and unchanged from baseline.  She is sexually active with a new partner and does not use protection.  No prior similar sxs.  LMP yest4erday. Past Medical History:  Diagnosis Date  . Asthma   . Chlamydia 2014   s/p treatment, ? TOC  . Hypertension    treated in 8th grade, unclear etiology, no recurrence since that time    Patient Active Problem List   Diagnosis Date Noted  . Leg swelling 02/19/2013  . Supervision of low-risk first pregnancy 02/07/2013  . Chlamydia infection complicating pregnancy in third trimester 02/07/2013  . Insufficient prenatal care in third trimester 02/07/2013    Past Surgical History:  Procedure Laterality Date  . NO PAST SURGERIES       OB History    Gravida  2   Para  1   Term  1   Preterm  0   AB  1   Living  1     SAB  1   TAB  0   Ectopic  0   Multiple  0   Live Births  1            Home Medications    Prior to Admission medications   Medication Sig Start Date End Date Taking? Authorizing Provider  ferrous sulfate 325 (65 FE) MG tablet Take 325 mg by mouth daily with breakfast.    [provider]   ibuprofen (ADVIL,MOTRIN) 800 MG tablet Take 1 tablet (800 mg total) by mouth 3 (three) times daily. 02/12/16   Arby Barrette, MD  metroNIDAZOLE (FLAGYL) 500 MG tablet Take 1 tablet (500 mg total) by mouth 2 (two) times daily. 05/17/18   Lurline Idol, FNP  nitrofurantoin, macrocrystal-monohydrate, (MACROBID) 100 MG capsule Take 1 capsule (100 mg total) by mouth 2 (two) times daily. 05/17/18   Lurline Idol, FNP  phenazopyridine (PYRIDIUM) 200 MG tablet Take 1 tablet (200 mg total) by mouth 3 (three) times daily. 05/17/18   Lurline Idol, FNP    Family History Family History  Problem Relation Age of Onset  . Diabetes Mother   . Hypertension Mother   . Hypertension Paternal Grandmother   . Sickle cell anemia Neg Hx   . Bleeding Disorder Neg Hx     Social History Social History   Tobacco Use  . Smoking status: Former Smoker    Types: Cigarettes  . Smokeless tobacco: Never Used  Substance Use Topics  . Alcohol use: Yes    Comment: occasional  . Drug use: Yes    Types: Marijuana    Comment: stopped in 05/2012  Allergies   Patient has no known allergies.   Review of Systems Review of Systems  Gastrointestinal: Positive for abdominal pain.  All other systems reviewed and are negative.    Physical Exam Updated Vital Signs BP 111/61 (BP Location: Right Arm)   Pulse 100   Temp 98.1 F (36.7 C) (Oral)   Resp 16   Ht 5\' 2"  (1.575 m)   Wt 64.9 kg   SpO2 100%   BMI 26.16 kg/m   Physical Exam Vitals signs and nursing note reviewed.  Constitutional:      Appearance: She is well-developed.  HENT:     Head: Normocephalic and atraumatic.  Cardiovascular:     Rate and Rhythm: Normal rate and regular rhythm.     Heart sounds: No murmur.  Pulmonary:     Effort: Pulmonary effort is normal. No respiratory distress.     Breath sounds: Normal breath sounds.  Abdominal:     Palpations: Abdomen is soft.     Tenderness: There is no rebound.     Comments:  Moderate suprapubic and lower abdominal tenderness, voluntary guarding.    Genitourinary:    Comments: Small amount of vaginal bleeding.  Moderate right adnexal tenderness without mass.  No CMT Musculoskeletal:        General: No tenderness.  Skin:    General: Skin is warm and dry.  Neurological:     Mental Status: She is alert and oriented to person, place, and time.  Psychiatric:        Behavior: Behavior normal.      ED Treatments / Results  Labs (all labs ordered are listed, but only abnormal results are displayed) Labs Reviewed  WET PREP, GENITAL  COMPREHENSIVE METABOLIC PANEL  CBC WITH DIFFERENTIAL/PLATELET  URINALYSIS, ROUTINE W REFLEX MICROSCOPIC  RPR  HIV ANTIBODY (ROUTINE TESTING W REFLEX)  POC URINE PREG, ED  GC/CHLAM[facility] AMP ([place]    EKG None  Radiology No results found.  Procedures Procedures (including critical care time)  Medications Ordered in ED Medications - No data to display   Initial Impression / Assessment and Plan / ED Course  I have reviewed the triage vital signs and the nursing notes.  Pertinent labs & imaging results that were available during my care of the patient were reviewed by me and considered in my medical decision making (see chart for details).        Patient here for evaluation of one week of lower abdominal pain. She does have significant tenderness on examination. Concern for appendicitis versus tubo-ovarian abscess versus ovarian cyst. Given abdominal pain is more in the right lower quadrant than pelvic region will start with CT abdomen pelvis. Patient eloped from the emergency department prior to being able to obtain her CT scan.  Final Clinical Impressions(s) / ED Diagnoses   Final diagnoses:  None    ED Discharge Orders    None       Tilden Fossaees, Janaki Exley, MD 07/22/18 1704

## 2018-07-23 LAB — GC/CHLAMYDIA PROBE AMP (~~LOC~~) NOT AT ARMC
Chlamydia: POSITIVE — AB
Neisseria Gonorrhea: NEGATIVE

## 2018-07-23 LAB — HIV ANTIBODY (ROUTINE TESTING W REFLEX): HIV Screen 4th Generation wRfx: NONREACTIVE

## 2018-07-23 LAB — RPR: RPR Ser Ql: NONREACTIVE

## 2021-04-21 ENCOUNTER — Encounter (HOSPITAL_COMMUNITY): Payer: Self-pay

## 2021-04-21 ENCOUNTER — Emergency Department (HOSPITAL_COMMUNITY): Payer: Self-pay

## 2021-04-21 ENCOUNTER — Other Ambulatory Visit: Payer: Self-pay

## 2021-04-21 ENCOUNTER — Emergency Department (HOSPITAL_COMMUNITY)
Admission: EM | Admit: 2021-04-21 | Discharge: 2021-04-21 | Disposition: A | Discharge status: Home / Self Care | Payer: Self-pay | Attending: Emergency Medicine | Admitting: Emergency Medicine

## 2021-04-21 DIAGNOSIS — X58XXXA Exposure to other specified factors, initial encounter: Secondary | ICD-10-CM | POA: Insufficient documentation

## 2021-04-21 DIAGNOSIS — S41002A Unspecified open wound of left shoulder, initial encounter: Secondary | ICD-10-CM | POA: Insufficient documentation

## 2021-04-21 DIAGNOSIS — W3400XA Accidental discharge from unspecified firearms or gun, initial encounter: Secondary | ICD-10-CM

## 2021-04-21 DIAGNOSIS — Z20822 Contact with and (suspected) exposure to covid-19: Secondary | ICD-10-CM | POA: Insufficient documentation

## 2021-04-21 LAB — RESP PANEL BY RT-PCR (FLU A&B, COVID) ARPGX2
Influenza A by PCR: NEGATIVE
Influenza B by PCR: NEGATIVE
SARS Coronavirus 2 by RT PCR: NEGATIVE

## 2021-04-21 MED ORDER — LORAZEPAM 2 MG/ML IJ SOLN
1.0000 mg | Freq: Once | INTRAMUSCULAR | Status: AC
  Administered 2021-04-21: 1 mg via INTRAVENOUS
  Filled 2021-04-21: qty 1

## 2021-04-21 MED ORDER — OXYCODONE-ACETAMINOPHEN 5-325 MG PO TABS
1.0000 | ORAL_TABLET | ORAL | Status: DC | PRN
  Administered 2021-04-21: 1 via ORAL
  Filled 2021-04-21: qty 1

## 2021-04-21 MED ORDER — BACITRACIN ZINC 500 UNIT/GM EX OINT
TOPICAL_OINTMENT | Freq: Two times a day (BID) | CUTANEOUS | Status: DC
  Filled 2021-04-21: qty 0.9

## 2021-04-21 MED ORDER — OXYCODONE-ACETAMINOPHEN 5-325 MG PO TABS: 1.0000 | ORAL_TABLET | Freq: Three times a day (TID) | ORAL | 0 refills | Status: AC | PRN

## 2021-04-21 MED ORDER — TETANUS-DIPHTH-ACELL PERTUSSIS 5-2.5-18.5 LF-MCG/0.5 IM SUSY
0.5000 mL | PREFILLED_SYRINGE | Freq: Once | INTRAMUSCULAR | Status: DC
  Filled 2021-04-21: qty 0.5
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
## 2021-04-21 NOTE — ED Notes (Signed)
Pt experiencing anxiety attack, ativan given
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
## 2021-04-21 NOTE — ED Notes (Signed)
RN reviewed discharge instructions with pt. Pt verbalized understanding and had no further questions. VSS upon discharge.  

## 2021-04-21 NOTE — ED Notes (Signed)
Pt refused Tdap.
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
## 2021-04-21 NOTE — Progress Notes (Signed)
Orthopedic Tech Progress Note Patient Details:  Tammy Gonzales 04-06-1995 [id number]  Patient ID: Tammy Gonzales, female   DOB: November 22, 1995, 26 y.o.   MRN: [id number] I attended trauma page. Tammy Gonzales 04/21/2021, 4:11 AM
